# Patient Record
Sex: Male | Born: 1980 | Race: White | Hispanic: No | Marital: Single | Smoking: Current every day smoker
Health system: Southern US, Community
[De-identification: ages and names within clinical notes are randomized; demographics above are authoritative.]

---

## 2020-05-11 ENCOUNTER — Emergency Department (HOSPITAL_COMMUNITY)
Admission: EM | Admit: 2020-05-11 | Discharge: 2020-05-12 | Disposition: A | Discharge status: Home / Self Care | Payer: Self-pay | Attending: Emergency Medicine | Admitting: Emergency Medicine

## 2020-05-11 ENCOUNTER — Other Ambulatory Visit: Payer: Self-pay

## 2020-05-11 DIAGNOSIS — M7989 Other specified soft tissue disorders: Secondary | ICD-10-CM

## 2020-05-11 DIAGNOSIS — M25471 Effusion, right ankle: Secondary | ICD-10-CM

## 2020-05-11 DIAGNOSIS — Y92149 Unspecified place in prison as the place of occurrence of the external cause: Secondary | ICD-10-CM | POA: Insufficient documentation

## 2020-05-11 DIAGNOSIS — R2241 Localized swelling, mass and lump, right lower limb: Secondary | ICD-10-CM | POA: Insufficient documentation
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
## 2020-05-12 ENCOUNTER — Emergency Department (HOSPITAL_COMMUNITY): Payer: Self-pay

## 2020-05-12 ENCOUNTER — Encounter (HOSPITAL_COMMUNITY): Payer: Self-pay | Admitting: Emergency Medicine

## 2020-05-12 NOTE — ED Notes (Signed)
Discharge instructions provided to patient. Verbalized understanding. Alert and oriented. Escorted out of ED via w/c. °

## 2020-05-12 NOTE — Progress Notes (Signed)
Orthopedic Tech Progress Note Patient Details:  Andrew Fleming 10/14/1980 [id number]  Ortho Devices Type of Ortho Device: Crutches, CAM walker Ortho Device/Splint Location: RLE Ortho Device/Splint Interventions: Application, Adjustment   Post Interventions Patient Tolerated: Well Instructions Provided: Adjustment of device, Poper ambulation with device   [name] 05/12/2020, 2:34 AM
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
## 2020-05-12 NOTE — Discharge Instructions (Addendum)
Use the boot for the next 2 weeks.  Please follow-up with the orthopedic doctor.  If your symptoms change or worsen, be seen by your PCP, in urgent care, or return to the ER.

## 2020-05-27 ENCOUNTER — Emergency Department (HOSPITAL_COMMUNITY)
Admission: EM | Admit: 2020-05-27 | Discharge: 2020-05-28 | Disposition: A | Discharge status: Home / Self Care | Payer: Self-pay | Attending: Emergency Medicine | Admitting: Emergency Medicine

## 2020-05-27 ENCOUNTER — Other Ambulatory Visit: Payer: Self-pay

## 2020-05-27 DIAGNOSIS — M25572 Pain in left ankle and joints of left foot: Secondary | ICD-10-CM | POA: Insufficient documentation

## 2020-05-27 DIAGNOSIS — M25571 Pain in right ankle and joints of right foot: Secondary | ICD-10-CM

## 2020-05-27 DIAGNOSIS — M7989 Other specified soft tissue disorders: Secondary | ICD-10-CM

## 2020-05-27 DIAGNOSIS — F17200 Nicotine dependence, unspecified, uncomplicated: Secondary | ICD-10-CM | POA: Insufficient documentation

## 2020-05-27 DIAGNOSIS — Y9355 Activity, bike riding: Secondary | ICD-10-CM | POA: Insufficient documentation

## 2020-05-28 ENCOUNTER — Other Ambulatory Visit: Payer: Self-pay

## 2020-05-28 ENCOUNTER — Encounter (HOSPITAL_COMMUNITY): Payer: Self-pay | Admitting: Emergency Medicine

## 2020-05-28 ENCOUNTER — Emergency Department (HOSPITAL_COMMUNITY): Payer: Self-pay

## 2020-05-28 LAB — CBC WITH DIFFERENTIAL/PLATELET
Abs Immature Granulocytes: 0.02 10*3/uL (ref 0.00–0.07)
Basophils Absolute: 0.1 10*3/uL (ref 0.0–0.1)
Basophils Relative: 1 %
Eosinophils Absolute: 0.2 10*3/uL (ref 0.0–0.5)
Eosinophils Relative: 2 %
HCT: 42.8 % (ref 39.0–52.0)
Hemoglobin: 14.4 g/dL (ref 13.0–17.0)
Immature Granulocytes: 0 %
Lymphocytes Relative: 31 %
Lymphs Abs: 2.5 10*3/uL (ref 0.7–4.0)
MCH: 33.1 pg (ref 26.0–34.0)
MCHC: 33.6 g/dL (ref 30.0–36.0)
MCV: 98.4 fL (ref 80.0–100.0)
Monocytes Absolute: 0.6 10*3/uL (ref 0.1–1.0)
Monocytes Relative: 8 %
Neutro Abs: 4.7 10*3/uL (ref 1.7–7.7)
Neutrophils Relative %: 58 %
Platelets: 230 10*3/uL (ref 150–400)
RBC: 4.35 MIL/uL (ref 4.22–5.81)
RDW: 14.5 % (ref 11.5–15.5)
WBC: 8.1 10*3/uL (ref 4.0–10.5)
nRBC: 0 % (ref 0.0–0.2)

## 2020-05-28 LAB — BASIC METABOLIC PANEL
Anion gap: 6 (ref 5–15)
BUN: 19 mg/dL (ref 6–20)
CO2: 27 mmol/L (ref 22–32)
Calcium: 8.9 mg/dL (ref 8.9–10.3)
Chloride: 105 mmol/L (ref 98–111)
Creatinine, Ser: 0.83 mg/dL (ref 0.61–1.24)
GFR, Estimated: >60 mL/min (ref >60)
Glucose, Bld: 83 mg/dL (ref 70–99)
Potassium: 4 mmol/L (ref 3.5–5.1)
Sodium: 138 mmol/L (ref 135–145)

## 2020-05-28 LAB — BRAIN NATRIURETIC PEPTIDE: B Natriuretic Peptide: 50 pg/mL (ref 0.0–100.0)
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
## 2020-05-28 NOTE — Discharge Instructions (Signed)
Take 4 over the counter ibuprofen tablets 3 times a day or 2 over-the-counter naproxen tablets twice a day for pain. Also take tylenol 1000mg(2 extra strength) four times a day.    

## 2020-05-29 ENCOUNTER — Ambulatory Visit: Payer: PRIVATE HEALTH INSURANCE

## 2020-08-19 ENCOUNTER — Encounter: Payer: Self-pay | Admitting: *Deleted

## 2020-08-19 NOTE — Congregational Nurse Program (Signed)
  Dept: [phone]   Congregational Nurse Program Note  Date of Encounter: 08/19/2020  Past Medical History: No past medical history on file.  Encounter Details:  CNP Questionnaire - 08/19/20 0843      Questionnaire   Do you give verbal consent to treat you today? Yes    Visit Setting Church or Organization    Location Patient Served At [facility]    Patient Status Homeless    Medical Provider No    Insurance Uninsured (Includes [employer] Card/[employer])    Intervention Refer;Support    Housing/Utilities No permanent housing    Transportation Provided transportation assistance ([facility] transp,bus pass, taxi voucher, Catering manager.)    Referrals Proofreader;Other   [facility]         Client came in requesting help with mental health services. He last had medication in [place] five months ago. While in [place] , he had "[place] blue " insurance. Client denies si and hi currently. Referred to [facility] walk in clinic. Gave bus pass for transportation. Client also asked about help with disability. Referred to [facility] and gave brochure with information. Rykar Lebleu W RN CN [phone]

## 2020-08-27 ENCOUNTER — Encounter (HOSPITAL_COMMUNITY): Payer: Self-pay

## 2020-08-27 ENCOUNTER — Other Ambulatory Visit: Payer: Self-pay

## 2020-08-27 ENCOUNTER — Emergency Department (HOSPITAL_COMMUNITY)
Admission: EM | Admit: 2020-08-27 | Discharge: 2020-08-28 | Disposition: A | Discharge status: Home / Self Care | Payer: Self-pay | Attending: Emergency Medicine | Admitting: Emergency Medicine

## 2020-08-27 DIAGNOSIS — K047 Periapical abscess without sinus: Secondary | ICD-10-CM | POA: Insufficient documentation

## 2020-08-27 DIAGNOSIS — L03211 Cellulitis of face: Secondary | ICD-10-CM | POA: Insufficient documentation

## 2020-08-27 DIAGNOSIS — F17200 Nicotine dependence, unspecified, uncomplicated: Secondary | ICD-10-CM | POA: Insufficient documentation

## 2020-08-27 MED ORDER — IBUPROFEN 800 MG PO TABS
800.0000 mg | ORAL_TABLET | Freq: Once | ORAL | Status: AC
  Administered 2020-08-27: 800 mg via ORAL
  Filled 2020-08-27: qty 1

## 2020-08-27 MED ORDER — CLINDAMYCIN PHOSPHATE 600 MG/50ML IV SOLN
600.0000 mg | Freq: Once | INTRAVENOUS | Status: AC
  Administered 2020-08-27: 600 mg via INTRAVENOUS
  Filled 2020-08-27: qty 50

## 2020-08-27 MED ORDER — SODIUM CHLORIDE 0.9 % IV BOLUS
1000.0000 mL | Freq: Once | INTRAVENOUS | Status: AC
  Administered 2020-08-27: 1000 mL via INTRAVENOUS

## 2020-08-27 NOTE — ED Triage Notes (Signed)
Pt sts right lower dental pain and swelling. Per pt it began today.

## 2020-08-27 NOTE — ED Provider Notes (Signed)
[facility]-EMERGENCY DEPT Provider Note   CSN: [id number] Arrival date & time: 08/27/20  2141     History Chief Complaint  Patient presents with  . Dental Pain    Andrew Fleming is a 40 y.o. male.  Patient with history of hepatitis C here with right-sided facial swelling and dental pain for the past 2 days.  States he woke up yesterday morning with swelling to the right lower jaw.  Complains of diffuse pain along his jawline admits to having poor dentition.  No difficulty breathing or difficulty swallowing.  There has been some purulent drainage.  No known fever.  No vomiting.  No chest pain or shortness of breath.  No abdominal pain.  No runny nose or sore throat.  The history is provided by the patient.  Dental Pain Associated symptoms: no fever and no headaches        History reviewed. No pertinent past medical history.  There are no problems to display for this patient.   History reviewed. No pertinent surgical history.     No family history on file.  Social History   Tobacco Use  . Smoking status: Current Every Day Smoker  . Smokeless tobacco: Never Used  Vaping Use  . Vaping Use: Never used  Substance Use Topics  . Alcohol use: Not Currently  . Drug use: Not Currently    Home Medications Prior to Admission medications   Not on File    Allergies    Patient has no known allergies.  Review of Systems   Review of Systems  Constitutional: Negative for activity change, appetite change and fever.  HENT: Positive for dental problem. Negative for trouble swallowing.   Respiratory: Negative for cough, chest tightness and shortness of breath.   Cardiovascular: Negative for chest pain.  Gastrointestinal: Negative for abdominal pain, blood in stool, nausea and vomiting.  Genitourinary: Negative for dysuria and hematuria.  Musculoskeletal: Negative for arthralgias and myalgias.  Skin: Negative for rash.  Neurological: Negative for  dizziness, weakness and headaches.   all other systems are negative except as noted in the HPI and PMH.    Physical Exam Updated Vital Signs BP 134/90 (BP Location: Left Arm)   Pulse 98   Temp 98 F (36.7 C) (Oral)   Resp 18   SpO2 98%   Physical Exam Vitals and nursing note reviewed.  Constitutional:      General: He is not in acute distress.    Appearance: He is well-developed. He is not ill-appearing.  HENT:     Head: Normocephalic and atraumatic.     Mouth/Throat:     Mouth: Mucous membranes are dry.     Pharynx: No oropharyngeal exudate.     Comments: Right-sided facial swelling.  Patient is controlling secretions.  Poor dentition on the right side.  There is purulent drainage from the right first and second molars. Floor of mouth is soft.  Controlling secretions Eyes:     Conjunctiva/sclera: Conjunctivae normal.     Pupils: Pupils are equal, round, and reactive to light.  Neck:     Comments: No meningismus. Cardiovascular:     Rate and Rhythm: Normal rate and regular rhythm.     Heart sounds: Normal heart sounds. No murmur heard.   Pulmonary:     Effort: Pulmonary effort is normal. No respiratory distress.     Breath sounds: Normal breath sounds.  Abdominal:     Palpations: Abdomen is soft.     Tenderness: There is  no abdominal tenderness. There is no guarding or rebound.  Musculoskeletal:        General: No tenderness. Normal range of motion.     Cervical back: Normal range of motion and neck supple.  Skin:    General: Skin is warm.  Neurological:     General: No focal deficit present.     Mental Status: He is alert and oriented to person, place, and time. Mental status is at baseline.     Cranial Nerves: No cranial nerve deficit.     Motor: No abnormal muscle tone.     Coordination: Coordination normal.     Comments: No ataxia on finger to nose bilaterally. No pronator drift. 5/5 strength throughout. CN 2-12 intact.Equal grip strength. Sensation intact.    Psychiatric:        Behavior: Behavior normal.     ED Results / Procedures / Treatments   Labs (all labs ordered are listed, but only abnormal results are displayed) Labs Reviewed  BASIC METABOLIC PANEL - Abnormal; Notable for the following components:      Result Value   Potassium 3.3 (*)    Calcium 8.6 (*)    All other components within normal limits  CBC WITH DIFFERENTIAL/PLATELET    EKG None  Radiology CT Maxillofacial W Contrast  Result Date: 08/28/2020 CLINICAL DATA:  Lower dental pain and swelling, right EXAM: CT MAXILLOFACIAL WITH CONTRAST TECHNIQUE: Multidetector CT imaging of the maxillofacial structures was performed with intravenous contrast. Multiplanar CT image reconstructions were also generated. CONTRAST:  8mL OMNIPAQUE IOHEXOL 300 MG/ML  SOLN COMPARISON:  None. FINDINGS: Osseous: No fracture or mandibular dislocation. No destructive process. Dental: There is a large carie of tooth 30. Two hundred thirty-two is incompletely erupted. There is a large amount of soft tissue swelling surrounding this region, predominantly superficially. No subperiosteal abscess. Orbits: Normal Sinuses: Retention cysts within both maxillary sinuses. Soft tissues: As above, large amount of lower right facial edema. No fluid collection. Limited intracranial: Normal. IMPRESSION: Odontogenic cellulitis of the lower right face. Suspected source is the large carie of tooth 30. Electronically Signed   By: Deatra Robinson M.D.   On: 08/28/2020 01:36    Procedures Procedures   Medications Ordered in ED Medications  ibuprofen (ADVIL) tablet 800 mg (has no administration in time range)  sodium chloride 0.9 % bolus 1,000 mL (has no administration in time range)    ED Course  I have reviewed the triage vital signs and the nursing notes.  Pertinent labs & imaging results that were available during my care of the patient were reviewed by me and considered in my medical decision making (see chart  for details).    MDM Rules/Calculators/A&P                         2 days of dental abscess.  It is spontaneously draining.  Floor of mouth is soft.  No evidence of Ludwig's angina.  Patient will be given IV antibiotics as well as IV fluids and labs will be obtained  Labs reassuring.  CT scan shows facial cellulitis without evidence of abscess. No evidence of Ludwig angina.  Patient tolerating p.o.  We will treat with antibiotics, warm compresses, pain control and dental follow-up. Oral surgery referral given.  Follow-up with oral surgeon and dentistry.   Return to the ED with difficulty breathing, difficulty swallowing, worsening swelling worsening pain or any other concerns. Final Clinical Impression(s) / ED Diagnoses Final diagnoses:  Dental abscess  Facial cellulitis    Rx / DC Orders ED Discharge Orders    None       Topanga Alvelo, Jeannett Senior, MD 08/28/20 0200

## 2020-08-28 ENCOUNTER — Emergency Department (HOSPITAL_COMMUNITY): Payer: Self-pay

## 2020-08-28 ENCOUNTER — Encounter (HOSPITAL_COMMUNITY): Payer: Self-pay

## 2020-08-28 LAB — CBC WITH DIFFERENTIAL/PLATELET
Abs Immature Granulocytes: 0.04 10*3/uL (ref 0.00–0.07)
Basophils Absolute: 0 10*3/uL (ref 0.0–0.1)
Basophils Relative: 0 %
Eosinophils Absolute: 0.1 10*3/uL (ref 0.0–0.5)
Eosinophils Relative: 1 %
HCT: 41.3 % (ref 39.0–52.0)
Hemoglobin: 14 g/dL (ref 13.0–17.0)
Immature Granulocytes: 0 %
Lymphocytes Relative: 22 %
Lymphs Abs: 2.3 10*3/uL (ref 0.7–4.0)
MCH: 31.8 pg (ref 26.0–34.0)
MCHC: 33.9 g/dL (ref 30.0–36.0)
MCV: 93.9 fL (ref 80.0–100.0)
Monocytes Absolute: 1 10*3/uL (ref 0.1–1.0)
Monocytes Relative: 9 %
Neutro Abs: 7.1 10*3/uL (ref 1.7–7.7)
Neutrophils Relative %: 68 %
Platelets: 181 10*3/uL (ref 150–400)
RBC: 4.4 MIL/uL (ref 4.22–5.81)
RDW: 12.4 % (ref 11.5–15.5)
WBC: 10.5 10*3/uL (ref 4.0–10.5)
nRBC: 0 % (ref 0.0–0.2)

## 2020-08-28 LAB — BASIC METABOLIC PANEL
Anion gap: 11 (ref 5–15)
BUN: 12 mg/dL (ref 6–20)
CO2: 26 mmol/L (ref 22–32)
Calcium: 8.6 mg/dL — ABNORMAL LOW (ref 8.9–10.3)
Chloride: 98 mmol/L (ref 98–111)
Creatinine, Ser: 0.79 mg/dL (ref 0.61–1.24)
GFR, Estimated: >60 mL/min (ref >60)
Glucose, Bld: 87 mg/dL (ref 70–99)
Potassium: 3.3 mmol/L — ABNORMAL LOW (ref 3.5–5.1)
Sodium: 135 mmol/L (ref 135–145)

## 2020-08-28 MED ORDER — IBUPROFEN 800 MG PO TABS: 800.0000 mg | ORAL_TABLET | Freq: Three times a day (TID) | ORAL | 0 refills | Status: AC | PRN

## 2020-08-28 MED ORDER — CLINDAMYCIN HCL 300 MG PO CAPS: 300.0000 mg | ORAL_CAPSULE | Freq: Three times a day (TID) | ORAL | 0 refills | Status: AC

## 2020-08-28 MED ORDER — IOHEXOL 300 MG/ML  SOLN
75.0000 mL | Freq: Once | INTRAMUSCULAR | Status: AC | PRN
  Administered 2020-08-28: 75 mL via INTRAVENOUS

## 2020-08-28 NOTE — ED Notes (Signed)
Patient tolerating crackers and milk with no difficulty

## 2020-08-28 NOTE — Discharge Instructions (Signed)
Your abscess is already draining.  Use warm compresses.  Take the antibiotics as prescribed.  Follow-up with your oral surgeon and dentist. Return to the ED if you develop difficulty breathing, difficulty swallowing, worsening swelling, any other concerns

## 2020-09-14 ENCOUNTER — Encounter: Payer: Self-pay | Admitting: *Deleted

## 2020-09-14 NOTE — Congregational Nurse Program (Signed)
  Dept: [phone]   Congregational Nurse Program Note  Date of Encounter: 09/14/2020  Past Medical History: No past medical history on file.  Encounter Details:  CNP Questionnaire - 09/14/20 1057      Questionnaire   Do you give verbal consent to treat you today? Yes    Visit Setting Church or Organization    Location Patient Served At [facility]    Patient Status Homeless    Medical Provider No    Insurance Uninsured (Includes [employer])    Intervention Refer;Support    Housing/Utilities No permanent housing    Transportation Provided transportation assistance ([facility] transp,bus pass, taxi voucher, etc.)    Referrals Behavioral Health Urgent Care          Client came to [facility] requesting a bus pass to [facility]. He reports he will take the bus with a friend. Apparently he did not go on prior referral and bus pass. Client says he has a place to stay but does not give any information. He says he has an appointment with someone set up for medical. Wrote address and referral information and gave a bus pass. Jan W RN CN [phone]

## 2020-10-20 ENCOUNTER — Emergency Department (HOSPITAL_COMMUNITY)
Admission: EM | Admit: 2020-10-20 | Discharge: 2020-10-21 | Disposition: A | Discharge status: Home / Self Care | Payer: Self-pay | Attending: Emergency Medicine | Admitting: Emergency Medicine

## 2020-10-20 ENCOUNTER — Emergency Department (HOSPITAL_COMMUNITY): Payer: Self-pay

## 2020-10-20 DIAGNOSIS — S60811A Abrasion of right wrist, initial encounter: Secondary | ICD-10-CM | POA: Insufficient documentation

## 2020-10-20 DIAGNOSIS — F17200 Nicotine dependence, unspecified, uncomplicated: Secondary | ICD-10-CM | POA: Insufficient documentation

## 2020-10-20 DIAGNOSIS — W2209XA Striking against other stationary object, initial encounter: Secondary | ICD-10-CM | POA: Insufficient documentation

## 2020-10-20 DIAGNOSIS — K0889 Other specified disorders of teeth and supporting structures: Secondary | ICD-10-CM | POA: Insufficient documentation

## 2020-10-20 DIAGNOSIS — S4991XA Unspecified injury of right shoulder and upper arm, initial encounter: Secondary | ICD-10-CM

## 2020-10-20 NOTE — ED Provider Notes (Signed)
Emergency Medicine Provider Triage Evaluation Note  Andrew Fleming , a 40 y.o. male  was evaluated in triage.  Pt complains of dental pain and right hand pain after punching a wall.  Review of Systems  Positive: Dental pain, hand pain Negative: fever  Physical Exam  BP 114/75 (BP Location: Right Arm)   Pulse (!) 106   Temp 98.1 F (36.7 C) (Oral)   Resp 16   SpO2 100%  Gen:   Awake, no distress   Resp:  Normal effort  MSK:   Moves extremities without difficulty  Other:  ttp to the right lower teeth, no evidence of deep space infection, ttp to the right wrist and hand  Medical Decision Making  Medically screening exam initiated at 11:08 PM.  Appropriate orders placed.  Burman Foster was informed that the remainder of the evaluation will be completed by another provider, this initial triage assessment does not replace that evaluation, and the importance of remaining in the ED until their evaluation is complete.     Karrie Meres, PA-C 10/20/20 2311    Virgina Norfolk, DO 10/21/20 1541

## 2020-10-20 NOTE — ED Triage Notes (Signed)
Pt reports he punch a wall last week and now have some opn blister abrasions on right arm and having right sided dental pain from an tooth abscess

## 2020-10-21 ENCOUNTER — Other Ambulatory Visit (HOSPITAL_COMMUNITY): Payer: Self-pay

## 2020-10-21 MED ORDER — PENICILLIN V POTASSIUM 500 MG PO TABS: 500.0000 mg | ORAL_TABLET | Freq: Four times a day (QID) | ORAL | 0 refills | Status: AC

## 2020-10-21 MED ORDER — PENICILLIN V POTASSIUM 500 MG PO TABS
ORAL_TABLET | ORAL | 0 refills | Status: AC
  Filled 2020-10-21: qty 28, 7d supply, fill #0

## 2020-10-21 NOTE — ED Provider Notes (Signed)
[facility] EMERGENCY DEPARTMENT Provider Note   CSN: [id number] Arrival date & time: 10/20/20  2225     History Chief Complaint  Patient presents with  . Arm Pain  . Dental Pain    Andrew Fleming is a 40 y.o. male.  HPI   40 year old male presents emergency department today for evaluation of multiple complaints.  He complains of right lower dental pain that has been present for the last several days.  He has some swelling to the right side of his face.  He denies any fevers at home.  He also is complaining of pain and abrasions to the right upper extremity after he punched a wall.  The pain is mainly located to the wrist.  He denies any other injuries.  No past medical history on file.  There are no problems to display for this patient.   No past surgical history on file.     No family history on file.  Social History   Tobacco Use  . Smoking status: Current Every Day Smoker  . Smokeless tobacco: Never Used  Vaping Use  . Vaping Use: Never used  Substance Use Topics  . Alcohol use: Not Currently  . Drug use: Not Currently    Home Medications Prior to Admission medications   Medication Sig Start Date End Date Taking? Authorizing Provider  penicillin v potassium (VEETID) 500 MG tablet Take 1 tablet (500 mg total) by mouth 4 (four) times daily for 7 days. 10/21/20 10/28/20 Yes Yaron Grasse S, PA-C  clindamycin (CLEOCIN) 300 MG capsule Take 1 capsule (300 mg total) by mouth 3 (three) times daily. 08/28/20   Rancour, Jeannett Senior, MD  ibuprofen (ADVIL) 800 MG tablet Take 1 tablet (800 mg total) by mouth every 8 (eight) hours as needed for moderate pain. 08/28/20   Glynn Octave, MD    Allergies    Patient has no known allergies.  Review of Systems   Review of Systems  Constitutional: Negative for fever.  HENT: Positive for dental problem. Negative for trouble swallowing.   Musculoskeletal:       Right wrist/hand pain  Skin: Positive for wound.   Neurological: Negative for weakness and numbness.    Physical Exam Updated Vital Signs BP 103/67 (BP Location: Left Arm)   Pulse 79   Temp 98.1 F (36.7 C) (Oral)   Resp 17   SpO2 100%   Physical Exam Constitutional:      General: He is not in acute distress.    Appearance: He is well-developed.  HENT:     Mouth/Throat:     Comments: Multiple missing teeth and multiple dental caries.  TTP to the right lower molars with associated tenderness to the right lower gumline with no evidence of periapical abscess.  No sublingual or submandibular swelling.  No trismus. Eyes:     Conjunctiva/sclera: Conjunctivae normal.  Cardiovascular:     Rate and Rhythm: Normal rate and regular rhythm.  Pulmonary:     Effort: Pulmonary effort is normal.     Breath sounds: Normal breath sounds.  Musculoskeletal:     Comments: TTP to the distal radius/ulna.  There is an abrasion to the dorsum of the wrist/hand that does not appear infected.  Full range of motion noted to the wrist/hand.  N/V I.  Skin:    General: Skin is warm and dry.  Neurological:     Mental Status: He is alert and oriented to person, place, and time.     ED  Results / Procedures / Treatments   Labs (all labs ordered are listed, but only abnormal results are displayed) Labs Reviewed - No data to display  EKG None  Radiology DG Wrist Complete Right  Result Date: 10/20/2020 CLINICAL DATA:  Punched wall, right wrist pain EXAM: RIGHT WRIST - COMPLETE 3+ VIEW COMPARISON:  None. FINDINGS: There is no evidence of fracture or dislocation. There is no evidence of arthropathy or other focal bone abnormality. Soft tissues are unremarkable. IMPRESSION: Negative. Electronically Signed   By: Helyn Numbers MD   On: 10/20/2020 23:46   DG Hand Complete Right  Result Date: 10/20/2020 CLINICAL DATA:  Punched wall, right hand pain EXAM: RIGHT HAND - COMPLETE 3+ VIEW COMPARISON:  None. FINDINGS: There is no evidence of fracture or dislocation.  There is no evidence of arthropathy or other focal bone abnormality. Soft tissues are unremarkable. IMPRESSION: Negative. Electronically Signed   By: Helyn Numbers MD   On: 10/20/2020 23:47    Procedures Procedures   Medications Ordered in ED Medications - No data to display  ED Course  I have reviewed the triage vital signs and the nursing notes.  Pertinent labs & imaging results that were available during my care of the patient were reviewed by me and considered in my medical decision making (see chart for details).    MDM Rules/Calculators/A&P                          Patient here with dental pain.  No gross abscess on exam.  No fevers.  No evidence of deep space infection.  Will treat with antibiotics, anti-inflammatories and give him resources to follow-up with a dentist.  He is additionally complaining of right wrist/hand pain after punching a wall.  He had x-rays performed of the wrist/hand which were personally reviewed and interpreted and there are no signs of any dislocation or fracture.  Advised on taking anti-inflammatories for pain advised on wound care.  Given PCP information for follow-up and advised on return precautions.  He voiced understanding of the plan and reasons to return.  All questions answered.  Patient stable for discharge  Final Clinical Impression(s) / ED Diagnoses Final diagnoses:  Pain, dental  Arm injuries, right, initial encounter    Rx / DC Orders ED Discharge Orders         Ordered    penicillin v potassium (VEETID) 500 MG tablet  4 times daily        10/21/20 0139           Karrie Meres, PA-C 10/21/20 0139    Nira Conn, MD 10/21/20 [phone]

## 2020-10-21 NOTE — ED Notes (Signed)
Patient verbalizes understanding of discharge instructions. Opportunity for questioning and answers were provided. Armband removed by staff, pt discharged from ED ambulatory.   

## 2020-10-21 NOTE — Discharge Instructions (Signed)
You were given a prescription for antibiotics. Please take the antibiotic prescription fully.   Follow up with a dentist in the next week   Please follow up with your primary care provider within 5-7 days for re-evaluation of your symptoms. If you do not have a primary care provider, information for a healthcare clinic has been provided for you to make arrangements for follow up care. Please return to the emergency department for any new or worsening symptoms.

## 2020-10-21 NOTE — ED Notes (Signed)
This RN went and found pt in lobby to give him his walking stick that he left in room. Pt told this RN "nah keep it and shove it up your ass."

## 2020-11-02 ENCOUNTER — Encounter (HOSPITAL_COMMUNITY): Payer: Self-pay

## 2020-11-02 ENCOUNTER — Emergency Department (HOSPITAL_COMMUNITY)
Admission: EM | Admit: 2020-11-02 | Discharge: 2020-11-02 | Disposition: A | Discharge status: Home / Self Care | Payer: BLUE CROSS/BLUE SHIELD | Attending: Emergency Medicine | Admitting: Emergency Medicine

## 2020-11-02 DIAGNOSIS — F10929 Alcohol use, unspecified with intoxication, unspecified: Secondary | ICD-10-CM | POA: Insufficient documentation

## 2020-11-02 DIAGNOSIS — T40604A Poisoning by unspecified narcotics, undetermined, initial encounter: Secondary | ICD-10-CM

## 2020-11-02 DIAGNOSIS — F17200 Nicotine dependence, unspecified, uncomplicated: Secondary | ICD-10-CM | POA: Diagnosis not present

## 2020-11-02 NOTE — ED Notes (Signed)
Patient given a sandwich and drink.Patient alert and oriented and shows no signs of drowsiness

## 2020-11-02 NOTE — ED Triage Notes (Signed)
Pt was found unresponsive with agonal breathing under the bridge on [address]. He responded to 2 mg narcan and is now alert and oriented

## 2020-11-02 NOTE — ED Provider Notes (Signed)
[facility]-EMERGENCY DEPT Provider Note   CSN: [id number] Arrival date & time: 11/02/20  1847     History No chief complaint on file.   Andrew Fleming is a 40 y.o. male.  40 year old male presents after ingesting an unknown substance just prior to arrival.  Patient is very evasive whether or not this was an opiate.  He was [place] bridge unresponsive.  EMS was called and patient given Narcan and he immediately regained consciousness.  Patient does admit to some alcohol use but is very evasive about whether or not he use any other substances.  States he feels back to his baseline        History reviewed. No pertinent past medical history.  There are no problems to display for this patient.   History reviewed. No pertinent surgical history.     History reviewed. No pertinent family history.  Social History   Tobacco Use  . Smoking status: Current Every Day Smoker  . Smokeless tobacco: Never Used  Vaping Use  . Vaping Use: Never used  Substance Use Topics  . Alcohol use: Not Currently  . Drug use: Not Currently    Home Medications Prior to Admission medications   Medication Sig Start Date End Date Taking? Authorizing Provider  clindamycin (CLEOCIN) 300 MG capsule Take 1 capsule (300 mg total) by mouth 3 (three) times daily. 08/28/20   Rancour, Jeannett Senior, MD  ibuprofen (ADVIL) 800 MG tablet Take 1 tablet (800 mg total) by mouth every 8 (eight) hours as needed for moderate pain. 08/28/20   Rancour, Jeannett Senior, MD  penicillin v potassium (VEETID) 500 MG tablet Take 1 tablet by mouth 4 times a day for 7 days 10/21/20   Couture, Cortni S, PA-C    Allergies    Patient has no known allergies.  Review of Systems   Review of Systems  All other systems reviewed and are negative.   Physical Exam Updated Vital Signs BP 122/82 (BP Location: Left Arm)   Pulse 85   Temp 98.1 F (36.7 C) (Oral)   Resp 18   SpO2 96%   Physical Exam Vitals and  nursing note reviewed.  Constitutional:      General: He is not in acute distress.    Appearance: Normal appearance. He is well-developed. He is not toxic-appearing.  HENT:     Head: Normocephalic and atraumatic.  Eyes:     General: Lids are normal.     Conjunctiva/sclera: Conjunctivae normal.     Pupils: Pupils are equal, round, and reactive to light.  Neck:     Thyroid: No thyroid mass.     Trachea: No tracheal deviation.  Cardiovascular:     Rate and Rhythm: Normal rate and regular rhythm.     Heart sounds: Normal heart sounds. No murmur heard. No gallop.   Pulmonary:     Effort: Pulmonary effort is normal. No respiratory distress.     Breath sounds: Normal breath sounds. No stridor. No decreased breath sounds, wheezing, rhonchi or rales.  Abdominal:     General: Bowel sounds are normal. There is no distension.     Palpations: Abdomen is soft.     Tenderness: There is no abdominal tenderness. There is no rebound.  Musculoskeletal:        General: No tenderness. Normal range of motion.     Cervical back: Normal range of motion and neck supple.  Skin:    General: Skin is warm and dry.  Findings: No abrasion or rash.  Neurological:     Mental Status: He is alert and oriented to person, place, and time.     GCS: GCS eye subscore is 4. GCS verbal subscore is 5. GCS motor subscore is 6.     Cranial Nerves: No cranial nerve deficit.     Sensory: No sensory deficit.  Psychiatric:        Speech: Speech normal.        Behavior: Behavior normal.     ED Results / Procedures / Treatments   Labs (all labs ordered are listed, but only abnormal results are displayed) Labs Reviewed - No data to display  EKG None  Radiology No results found.  Procedures Procedures   Medications Ordered in ED Medications - No data to display  ED Course  I have reviewed the triage vital signs and the nursing notes.  Pertinent labs & imaging results that were available during my care of  the patient were reviewed by me and considered in my medical decision making (see chart for details).    MDM Rules/Calculators/A&P                          Patient monitored here and is stable.  Will discharge Final Clinical Impression(s) / ED Diagnoses Final diagnoses:  None    Rx / DC Orders ED Discharge Orders    None       Lorre Nick, MD 11/02/20 2044

## 2022-12-02 IMAGING — DX DG WRIST COMPLETE 3+V*R*
3 series · 3 of 3 positions shown · non-contrast
Comparison: None.

CLINICAL DATA: Punched wall, right wrist pain

EXAM:
RIGHT WRIST - COMPLETE 3+ VIEW

[wrist pa]
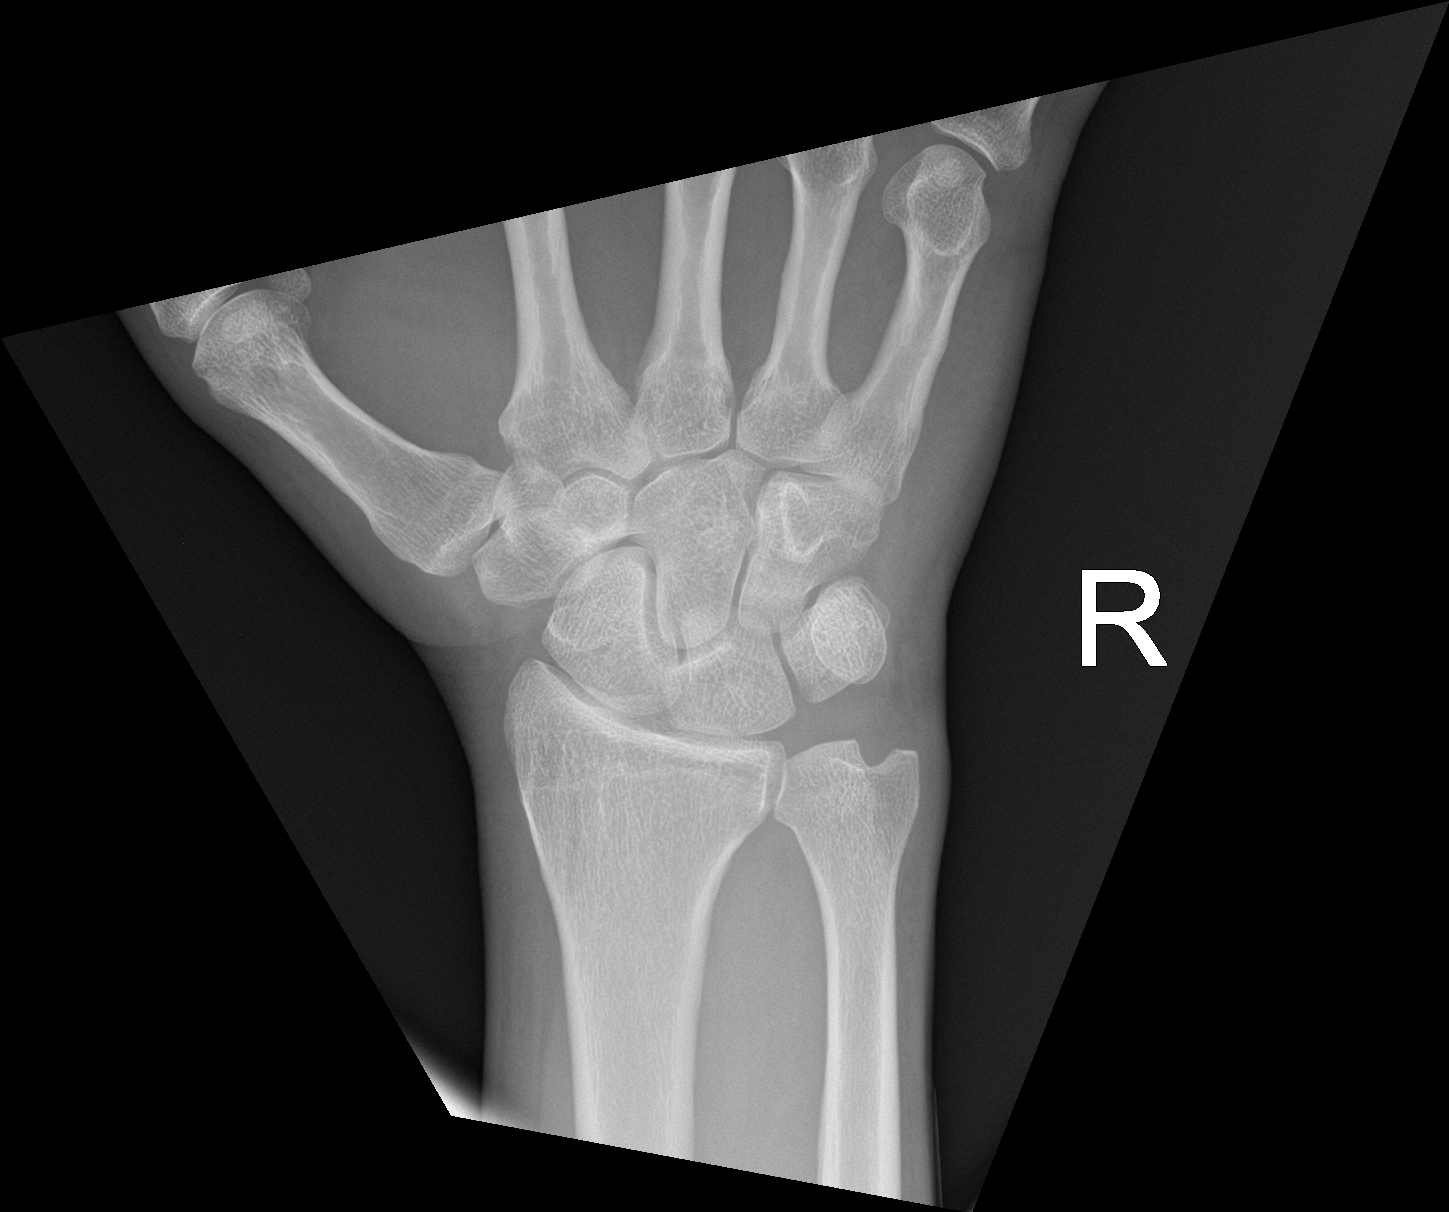

[wrist obl]
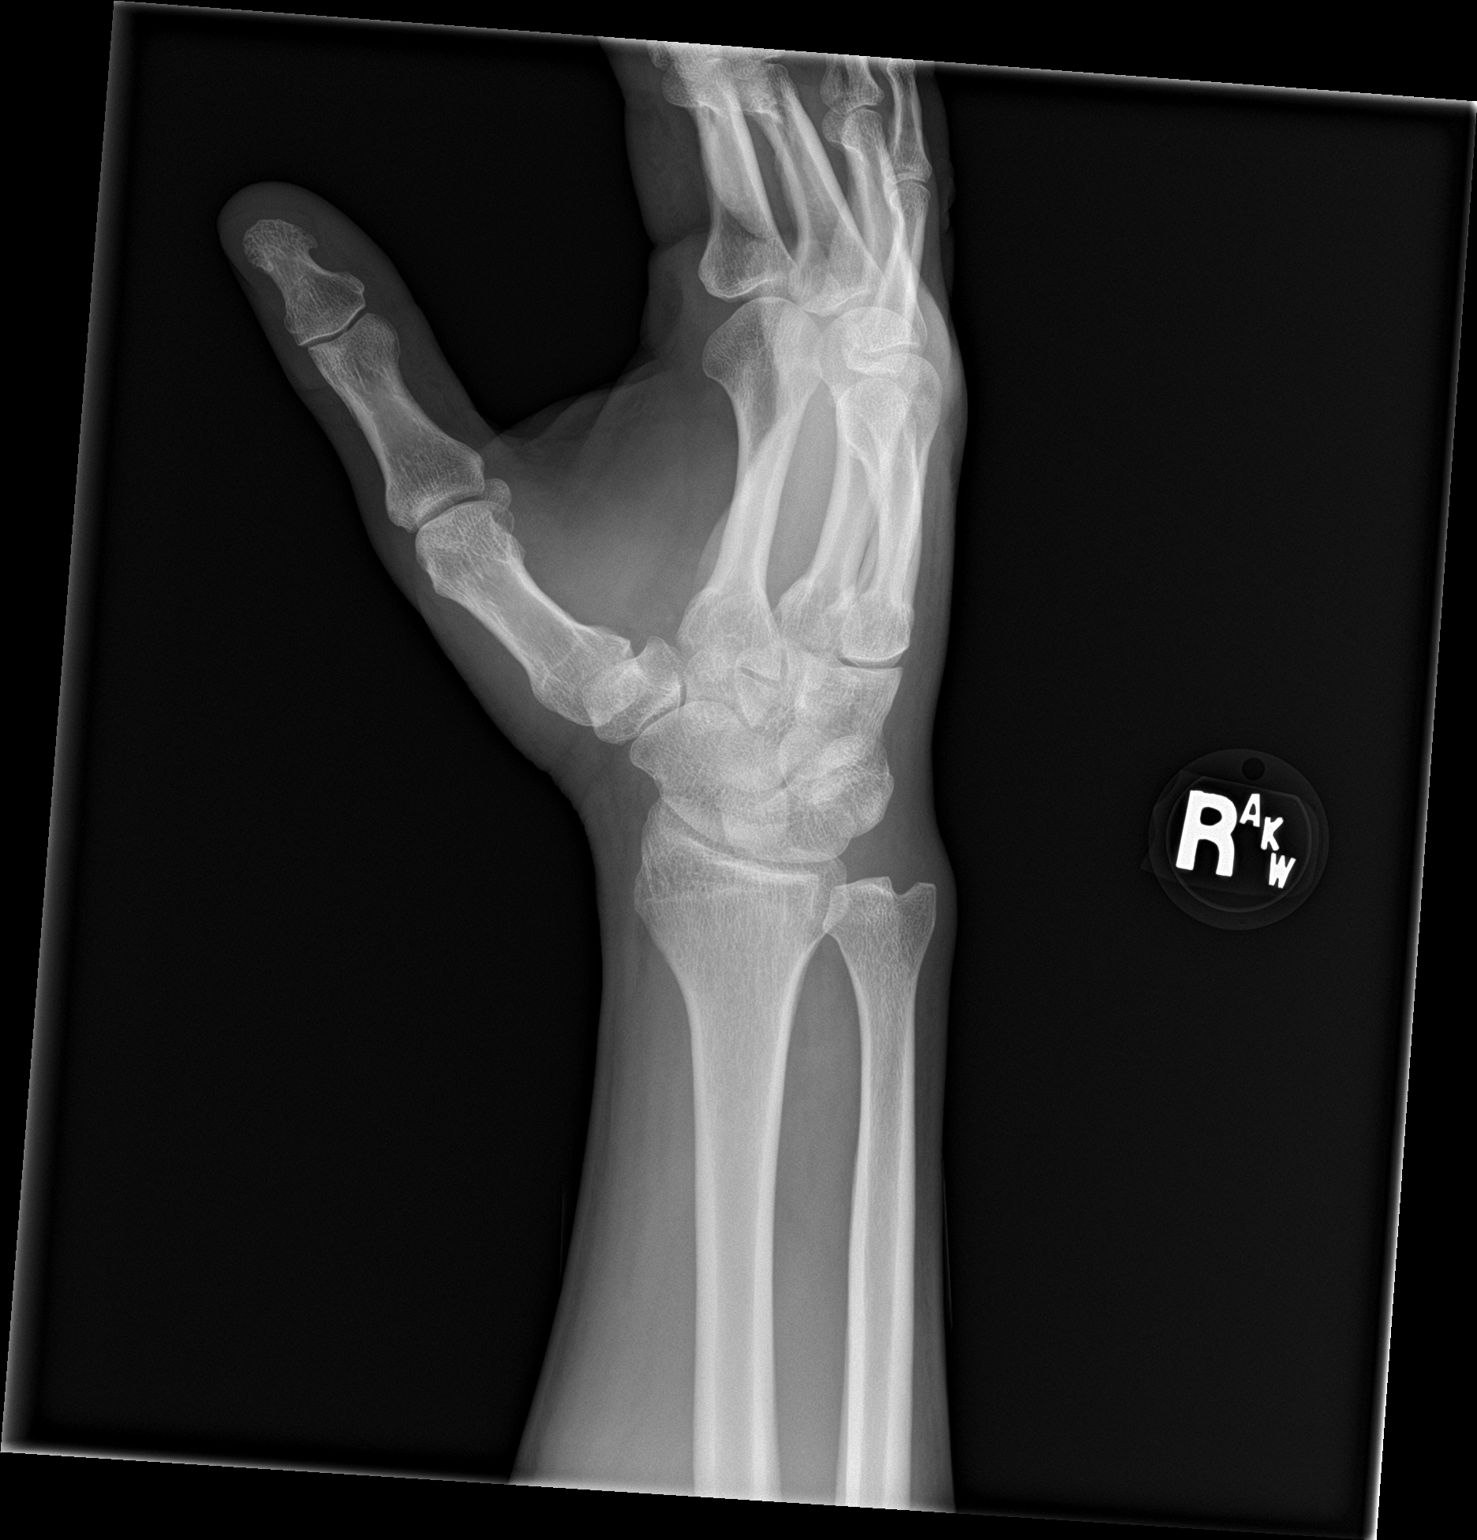

[wrist lat]
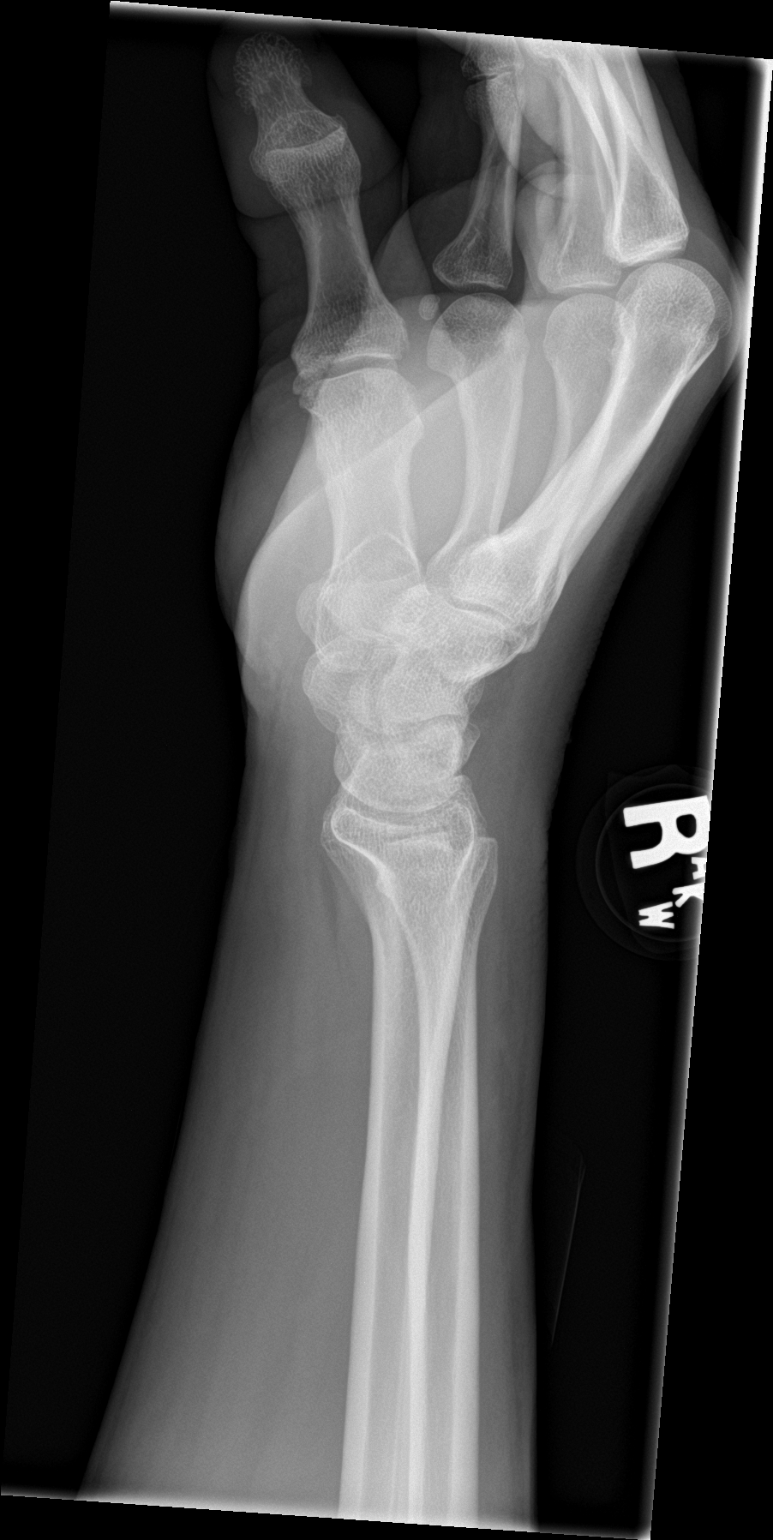

[3 of 3 positions shown; findings below may reference images not displayed]

FINDINGS: There is no evidence of fracture or dislocation. There is no
evidence of arthropathy or other focal bone abnormality. Soft
tissues are unremarkable.
IMPRESSION: Negative.
